# Patient Record
Sex: Male | Born: 2000 | Race: Black or African American | Hispanic: No | Marital: Single | Smoking: Never smoker
Health system: Southern US, Community
[De-identification: ages and names within clinical notes are randomized; demographics above are authoritative.]

---
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
## 2019-09-10 ENCOUNTER — Emergency Department
Admission: EM | Admit: 2019-09-10 | Discharge: 2019-09-10 | Disposition: A | Discharge status: Home / Self Care | Payer: BLUE CROSS/BLUE SHIELD | Attending: Emergency Medicine | Admitting: Emergency Medicine

## 2019-09-10 ENCOUNTER — Other Ambulatory Visit: Payer: Self-pay

## 2019-09-10 DIAGNOSIS — M79642 Pain in left hand: Secondary | ICD-10-CM | POA: Insufficient documentation

## 2019-09-10 DIAGNOSIS — Z5321 Procedure and treatment not carried out due to patient leaving prior to being seen by health care provider: Secondary | ICD-10-CM | POA: Insufficient documentation

## 2019-09-10 NOTE — ED Triage Notes (Signed)
Pt arrived via POV with reports of left palm pain, states he has a spot which resembles a callous that he has had for several months, pt states he recently played top golf and thinks that worsened the pain. Pt states the pain is 4/10 currently.

## 2021-08-16 IMAGING — CT CT ABD-PELV W/ CM
2 of 4 series · 16 of 46 positions shown, 18 images · IV contrast (APPLIED)
Comparison: Abdomen radiograph [REDACTED] [DATE]

CLINICAL DATA: Sudden onset of sharp right lower quadrant abdominal
pain and nausea and vomiting.

EXAM:
CT ABDOMEN AND PELVIS WITH CONTRAST
TECHNIQUE: Multidetector CT imaging of the abdomen and pelvis was performed
using the standard protocol following bolus administration of
intravenous contrast.
CONTRAST:  100mL OMNIPAQUE IOHEXOL 300 MG/ML  SOLN

[Series 2: axial st · axial · 0.64mm/px · z∈[-398,-28]mm · 13 of 82 slices shown, 15 images]
[im 4/82  soft-tissue]
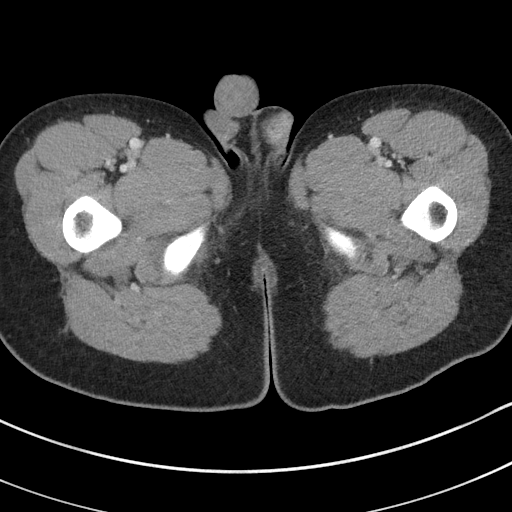
[im 4/82  bone]
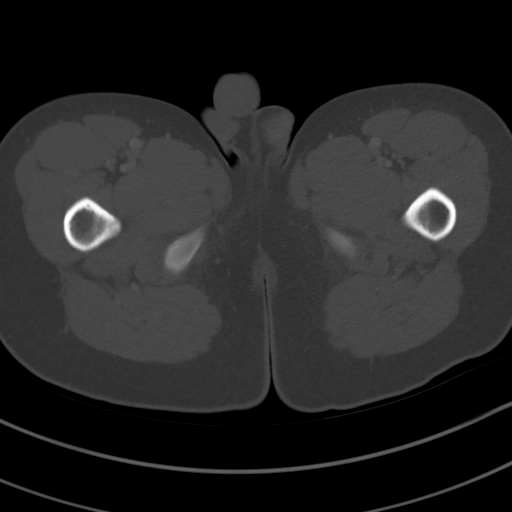
[im 10/82  soft-tissue]
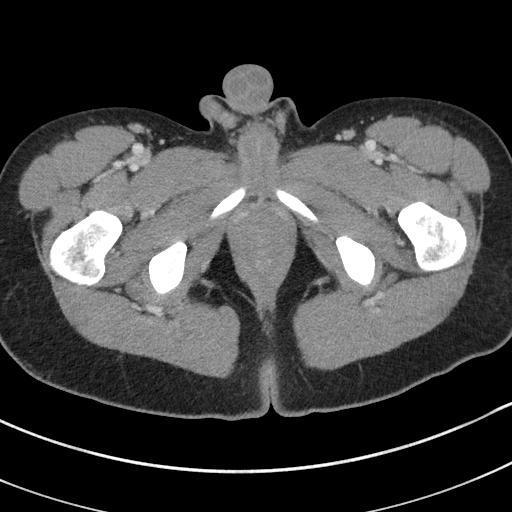
[im 17/82  soft-tissue]
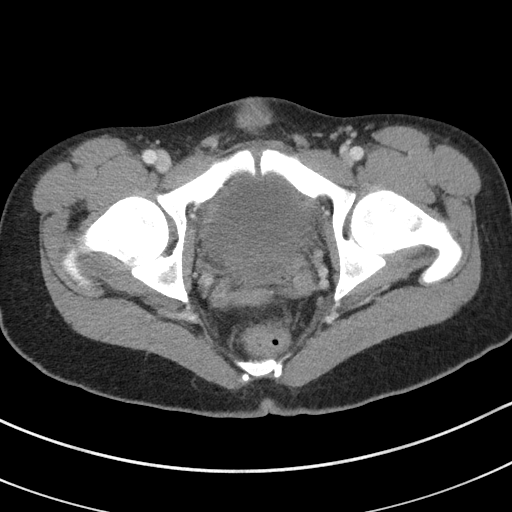
[im 23/82  soft-tissue]
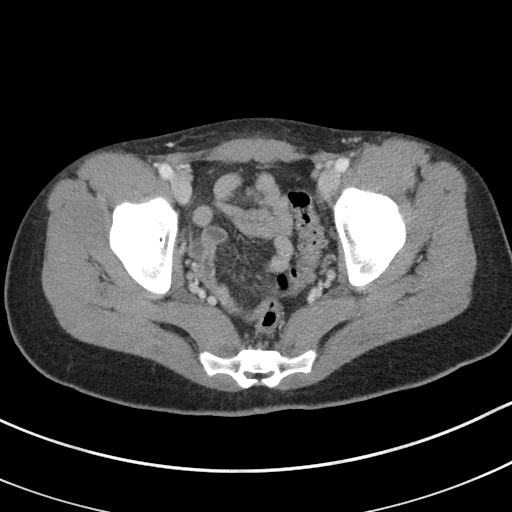
[im 30/82  soft-tissue]
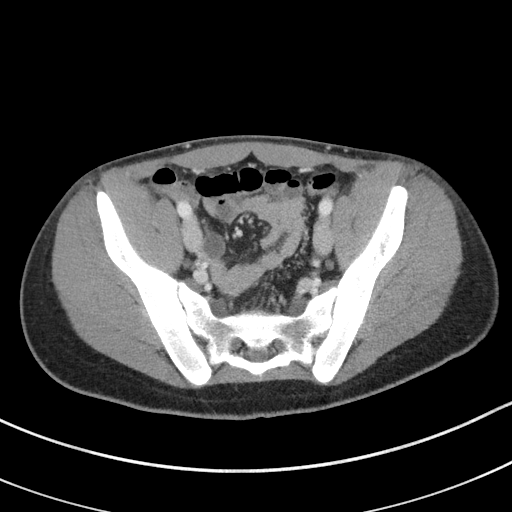
[im 36/82  soft-tissue]
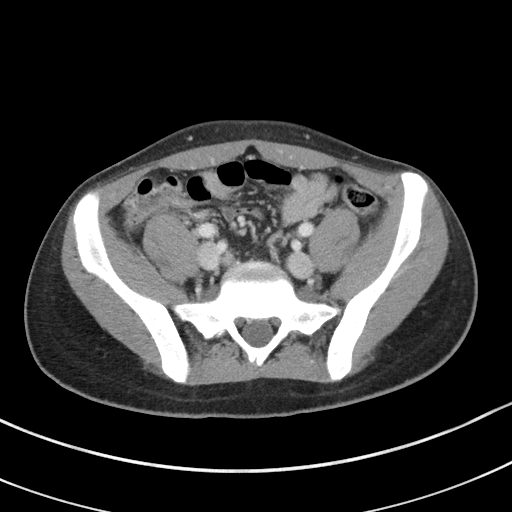
[im 43/82  soft-tissue]
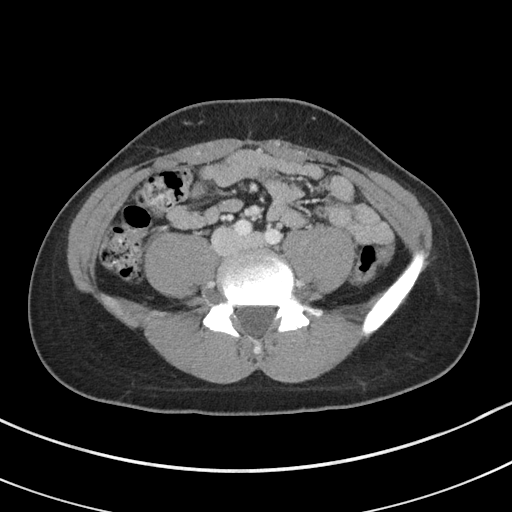
[im 46/82  soft-tissue]
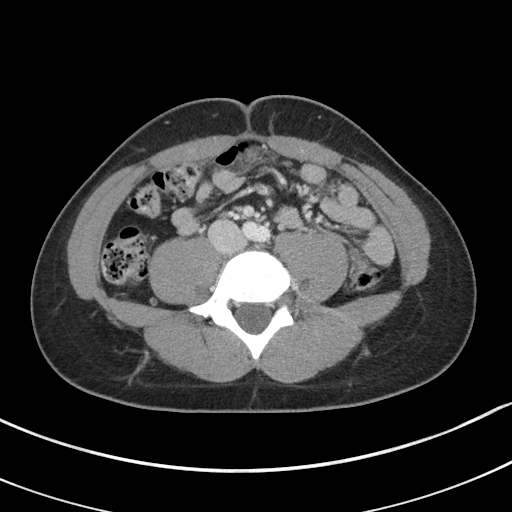
[im 52/82  soft-tissue]
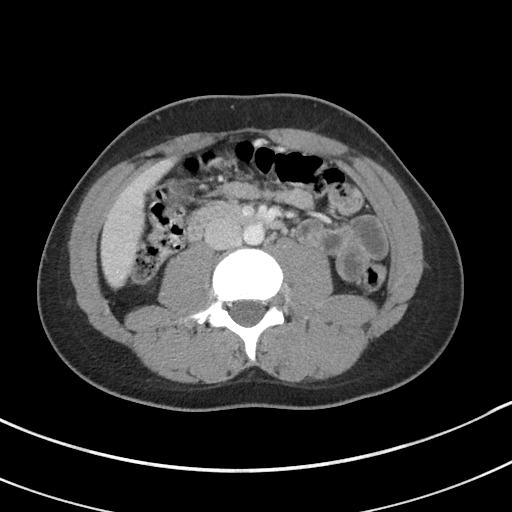
[im 52/82  bone]
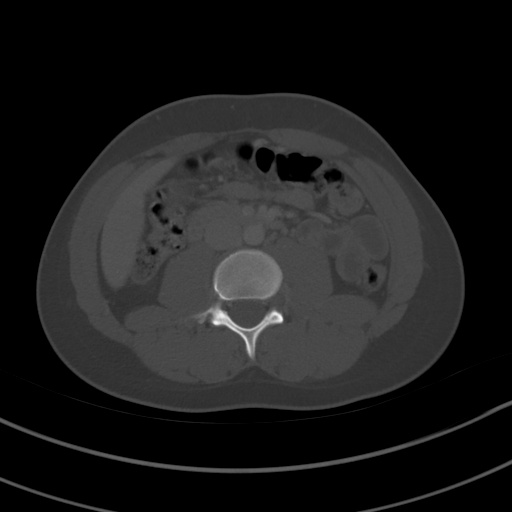
[im 59/82  soft-tissue]
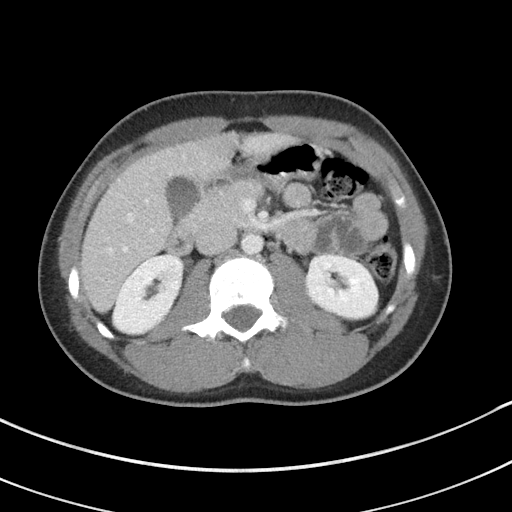
[im 65/82  soft-tissue]
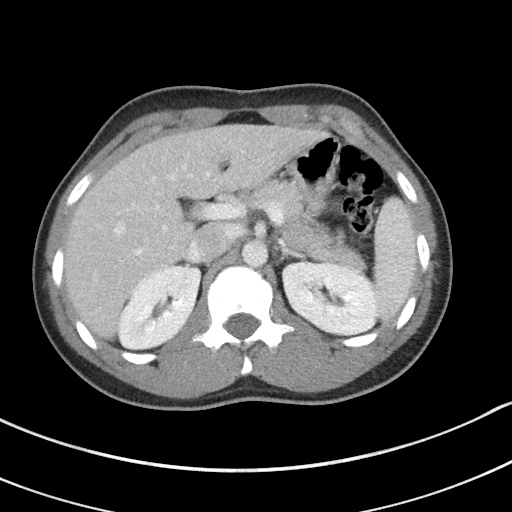
[im 72/82  soft-tissue]
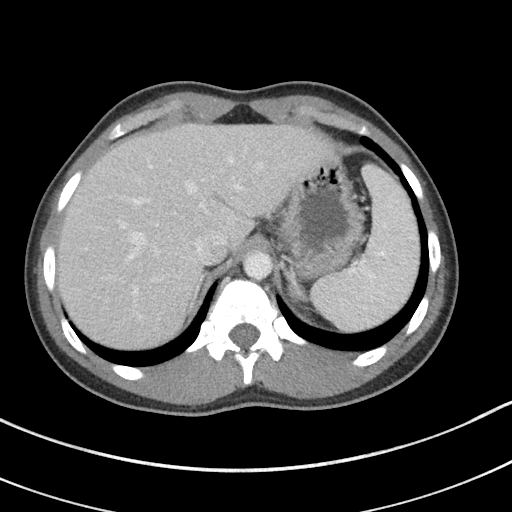
[im 78/82  soft-tissue]
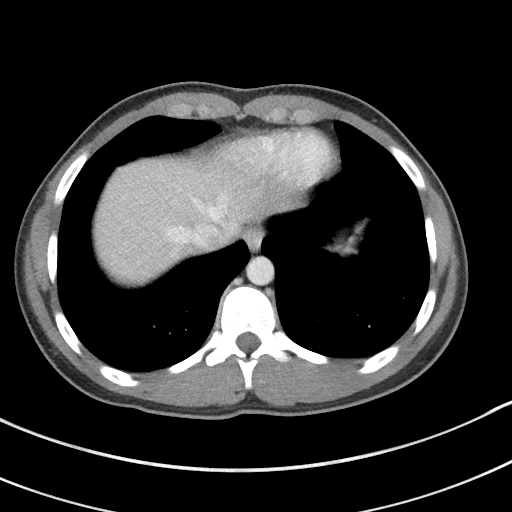

[Series 5: coronal st · coronal · 0.70mm/px · 3 of 68 slices shown]
[im 23/68  soft-tissue]
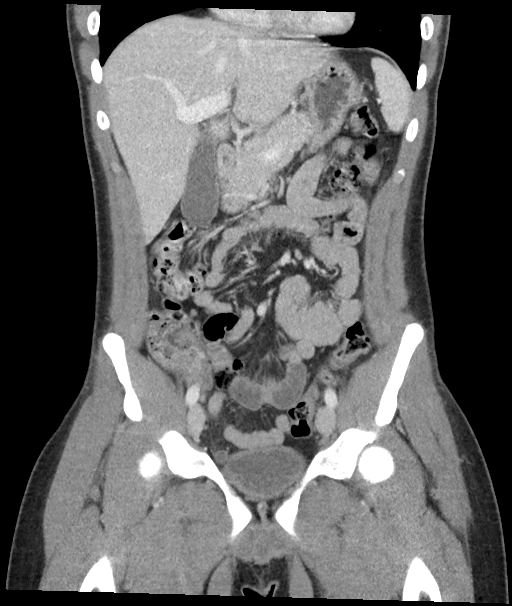
[im 30/68  soft-tissue]
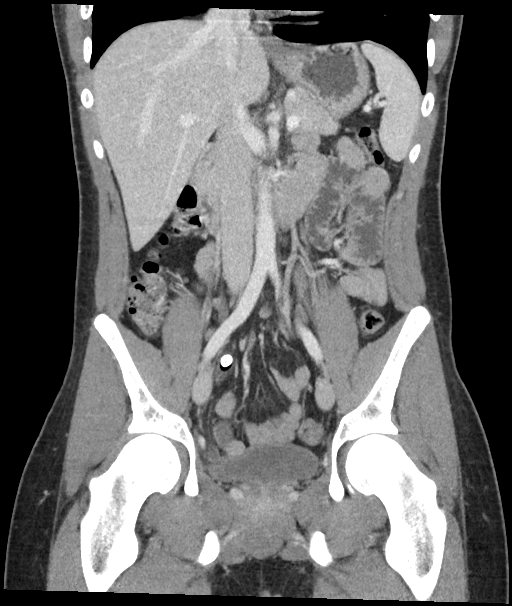
[im 38/68  soft-tissue]
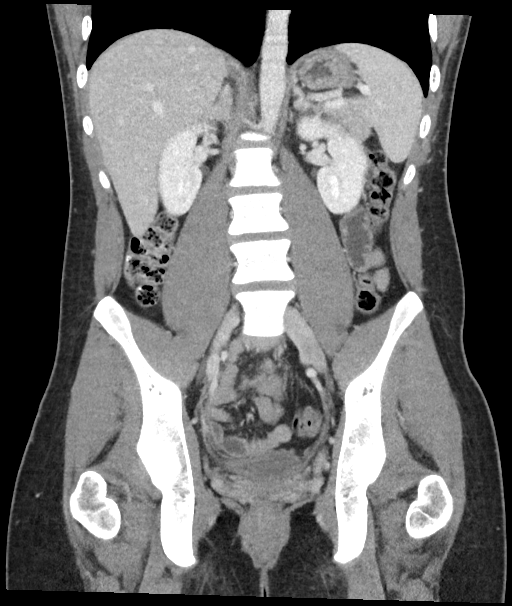

[16 of 46 positions shown; findings below may reference images not displayed]

FINDINGS: Lower chest: Unremarkable

Hepatobiliary: Unremarkable

Pancreas: Unremarkable

Spleen: Unremarkable

Adrenals/Urinary Tract: Unremarkable

Stomach/Bowel: On image 50/2, there is a large (1.3 by 0.8 by
cm) appendicolith in the appendix. Distal to this in the appendix
there is considerable dilatation, an air-fluid level, and abnormal
periappendiceal stranding. There is also a second more distal
appendicolith measuring 0.4 cm. Overall the appearance favors acute
appendicitis due to this large appendicolith. No appendiceal rupture
correction luminal gas.

Vascular/Lymphatic: Unremarkable

Reproductive: Unremarkable

Other: No supplemental non-categorized findings.

Musculoskeletal: Unremarkable
IMPRESSION: 1. Mild acute appendicitis, with periappendiceal stranding and
distal appendiceal dilation related to a large appendicolith
probably causing some degree of appendiceal obstruction. No
extraluminal gas or abscess.

## 2022-06-18 ENCOUNTER — Ambulatory Visit
Admission: EM | Admit: 2022-06-18 | Discharge: 2022-06-18 | Disposition: A | Discharge status: Home / Self Care | Payer: BC Managed Care – PPO | Attending: Internal Medicine | Admitting: Internal Medicine

## 2022-06-18 DIAGNOSIS — R509 Fever, unspecified: Secondary | ICD-10-CM

## 2022-06-18 LAB — POCT RAPID STREP A (OFFICE): Rapid Strep A Screen: NEGATIVE

## 2022-06-18 NOTE — Discharge Instructions (Addendum)
Follow up here or with your primary care provider if your symptoms are worsening or not improving.     

## 2022-06-18 NOTE — ED Triage Notes (Signed)
Pt presents with fever, body aches and sore throat starting last night. Temp 99.8 this morning. No meds today.  Throat was scratchy, is not hurting now.

## 2022-06-18 NOTE — ED Provider Notes (Addendum)
Andre Stanley    CSN: [id number] Arrival date & time: 06/18/22  1950      History   Chief Complaint Chief Complaint  Patient presents with   Fever    HPI Andre Stanley is a 22 y.o. male.    Fever   Patient presents to urgent care with complaint of fever, body aches, sore throat starting last night.  He endorses persistent low-grade fever of 99.8 F.  Reports "scratchy" throat which is not painful at the moment.  History reviewed. No pertinent past medical history.  Patient Active Problem List   Diagnosis Date Noted   Status post laparoscopic appendectomy 12/15/2018    Past Surgical History:  Procedure Laterality Date   LAPAROSCOPIC APPENDECTOMY N/A 11/30/2018   Procedure: APPENDECTOMY LAPAROSCOPIC;  Surgeon: Campbell Lerner, MD;  Location: [facility] ORS;  Service: General;  Laterality: N/A;       Home Medications    Prior to Admission medications   Medication Sig Start Date End Date Taking? Authorizing Provider  ibuprofen (ADVIL) 600 MG tablet Take 1 tablet (600 mg total) by mouth every 8 (eight) hours as needed for mild pain or moderate pain. 12/02/18  Yes Sung Amabile, DO    Family History History reviewed. No pertinent family history.  Social History Social History   Tobacco Use   Smoking status: Never   Smokeless tobacco: Never  Vaping Use   Vaping Use: Never used  Substance Use Topics   Alcohol use: Never   Drug use: Never     Allergies   Patient has no known allergies.   Review of Systems Review of Systems  Constitutional:  Positive for fever.     Physical Exam Triage Vital Signs ED Triage Vitals [06/18/22 2011]  Enc Vitals Group     BP 110/64     Pulse Rate 74     Resp 18     Temp 100.2 F (37.9 C)     Temp Source Oral     SpO2 96 %     Weight      Height      Head Circumference      Peak Flow      Pain Score 0     Pain Loc      Pain Edu?      Excl. in GC?    No data found.  Updated Vital Signs BP 110/64 (BP  Location: Left Arm)   Pulse 74   Temp 100.2 F (37.9 C) (Oral)   Resp 18   SpO2 96%   Visual Acuity Right Eye Distance:   Left Eye Distance:   Bilateral Distance:    Right Eye Near:   Left Eye Near:    Bilateral Near:     Physical Exam Vitals reviewed.  Constitutional:      Appearance: Normal appearance. He is not ill-appearing.  HENT:     Right Ear: Tympanic membrane normal.     Left Ear: Tympanic membrane normal.     Mouth/Throat:     Mouth: Mucous membranes are moist.     Pharynx: Posterior oropharyngeal erythema present. No oropharyngeal exudate.  Cardiovascular:     Rate and Rhythm: Normal rate and regular rhythm.     Pulses: Normal pulses.     Heart sounds: Normal heart sounds.  Pulmonary:     Effort: Pulmonary effort is normal.     Breath sounds: Normal breath sounds.  Abdominal:     General: Bowel sounds are normal.  Palpations: Abdomen is soft.     Comments: Endorses mild periumbilical discomfort with deep palpation.  Skin:    General: Skin is warm and dry.  Neurological:     General: No focal deficit present.     Mental Status: He is alert and oriented to person, place, and time.  Psychiatric:        Mood and Affect: Mood normal.        Behavior: Behavior normal.      UC Treatments / Results  Labs (all labs ordered are listed, but only abnormal results are displayed) Labs Reviewed  POCT RAPID STREP A (OFFICE)    EKG   Radiology No results found.  Procedures Procedures (including critical care time)  Medications Ordered in UC Medications - No data to display  Initial Impression / Assessment and Plan / UC Course  I have reviewed the triage vital signs and the nursing notes.  Pertinent labs & imaging results that were available during my care of the patient were reviewed by me and considered in my medical decision making (see chart for details).   Andre Stanley is a 22 y.o. male presenting with fever. Patient is afebrile without recent  antipyretics, satting well on room air. Overall is very well appearing, well hydrated, without respiratory distress. Pulmonary exam is unremarkable.  Lungs CTAB without wheezing, rhonchi, rales. RRR.  There is pharyngeal erythema but without peritonsillar exudates.  Abdomen is soft.  Bowel sounds normoactive.  Mild periumbilical tenderness with deep palpation.  Reviewed relevant chart history.   Patient's symptoms are consistent with an acute viral process.  There is no evidence of bacterial infection possible pharyngeal erythema.  Given he has no throat pain currently and only mild throat pain reported earlier, this is low on the differential. Rapid strep is negative. Recommending supportive care and use of OTC medication for symptom control as needed.  Counseled patient on potential for adverse effects with medications prescribed/recommended today, ER and return-to-clinic precautions discussed, patient verbalized understanding and agreement with care plan.  Final Clinical Impressions(s) / UC Diagnoses   Final diagnoses:  None   Discharge Instructions   None    ED Prescriptions   None    PDMP not reviewed this encounter.   Charma Igo, FNP 06/18/22 2049    Charma Igo, FNP 06/18/22 2053

## 2023-02-25 ENCOUNTER — Ambulatory Visit
Admission: EM | Admit: 2023-02-25 | Discharge: 2023-02-25 | Disposition: A | Discharge status: Home / Self Care | Payer: BC Managed Care – PPO | Attending: Emergency Medicine | Admitting: Emergency Medicine

## 2023-02-25 DIAGNOSIS — J069 Acute upper respiratory infection, unspecified: Secondary | ICD-10-CM | POA: Diagnosis not present

## 2023-02-25 MED ORDER — PREDNISONE 10 MG (21) PO TBPK: ORAL_TABLET | Freq: Every day | ORAL | 0 refills | Status: AC

## 2023-02-25 MED ORDER — BENZONATATE 100 MG PO CAPS: 100.0000 mg | ORAL_CAPSULE | Freq: Three times a day (TID) | ORAL | 0 refills | Status: AC

## 2023-02-25 MED ORDER — PROMETHAZINE-DM 6.25-15 MG/5ML PO SYRP: 5.0000 mL | ORAL_SOLUTION | Freq: Every evening | ORAL | 0 refills | Status: AC | PRN

## 2023-02-25 NOTE — Discharge Instructions (Addendum)
Your symptoms today are most likely being caused by a virus and should steadily improve in time it can take up to 7 to 10 days before you truly start to see a turnaround however things will get better  Begin prednisone every morning with food as directed to open and relax the airway  May use Tessalon pill every 8 hours as needed to help with coughing, may use cough syrup at bedtime for comfort   You can take Tylenol and/or Ibuprofen as needed for fever reduction and pain relief.   For cough: honey 1/2 to 1 teaspoon (you can dilute the honey in water or another fluid).  You can also use guaifenesin and dextromethorphan for cough. You can use a humidifier for chest congestion and cough.  If you don't have a humidifier, you can sit in the bathroom with the hot shower running.      For sore throat: try warm salt water gargles, cepacol lozenges, throat spray, warm tea or water with lemon/honey, popsicles or ice, or OTC cold relief medicine for throat discomfort.   For congestion: take a daily anti-histamine like Zyrtec, Claritin, and a oral decongestant, such as pseudoephedrine.  You can also use Flonase 1-2 sprays in each nostril daily.   It is important to stay hydrated: drink plenty of fluids (water, gatorade/powerade/pedialyte, juices, or teas) to keep your throat moisturized and help further relieve irritation/discomfort.

## 2023-02-25 NOTE — ED Provider Notes (Signed)
Andre Stanley    CSN: [id number] Arrival date & time: 02/25/23  1006      History   Chief Complaint Chief Complaint  Patient presents with   Cough   Nasal Congestion    HPI Andre Stanley is a 23 y.o. male.   Patient presents for evaluation of nasal congestion, rhinorrhea, productive cough with yellow, centralized chest tightness, shortness of breath only after eating 2 days ago.  1 day ago, has been negative.  Has attempted use of Sudafed.  Tolerating food and liquids.  known sick contacts.  History reviewed. No pertinent past medical history.  Patient Active Problem List   Diagnosis Date Noted   Status post laparoscopic appendectomy 12/15/2018    Past Surgical History:  Procedure Laterality Date   LAPAROSCOPIC APPENDECTOMY N/A 11/30/2018   Procedure: APPENDECTOMY LAPAROSCOPIC;  Surgeon: Campbell Lerner, MD;  Location: [facility] ORS;  Service: General;  Laterality: N/A;       Home Medications    Prior to Admission medications   Medication Sig Start Date End Date Taking? Authorizing Provider  benzonatate (TESSALON) 100 MG capsule Take 1 capsule (100 mg total) by mouth every 8 (eight) hours. 02/25/23  Yes Quillan Whitter R, NP  predniSONE (STERAPRED UNI-PAK 21 TAB) 10 MG (21) TBPK tablet Take by mouth daily. Take 6 tabs by mouth daily  for 1 days, then 5 tabs for 1 days, then 4 tabs for 1 days, then 3 tabs for 1 days, 2 tabs for 1 days, then 1 tab by mouth daily for 1 days 02/25/23  Yes Staci Carver R, NP  promethazine-dextromethorphan (PROMETHAZINE-DM) 6.25-15 MG/5ML syrup Take 5 mLs by mouth at bedtime as needed. 02/25/23  Yes Xitlali Kastens R, NP  ibuprofen (ADVIL) 600 MG tablet Take 1 tablet (600 mg total) by mouth every 8 (eight) hours as needed for mild pain or moderate pain. 12/02/18   Sung Amabile, DO    Family History History reviewed. No pertinent family history.  Social History Social History   Tobacco Use   Smoking status: Never   Smokeless  tobacco: Never  Vaping Use   Vaping status: Never Used  Substance Use Topics   Alcohol use: Never   Drug use: Never     Allergies   Patient has no known allergies.   Review of Systems Review of Systems   Physical Exam Triage Vital Signs ED Triage Vitals [02/25/23 1029]  Encounter Vitals Group     BP 132/82     Systolic BP Percentile      Diastolic BP Percentile      Pulse Rate (!) 58     Resp 18     Temp 99 F (37.2 C)     Temp Source Oral     SpO2 96 %     Weight      Height      Head Circumference      Peak Flow      Pain Score      Pain Loc      Pain Education      Exclude from Growth Chart    No data found.  Updated Vital Signs BP 132/82 (BP Location: Right Arm)   Pulse (!) 58   Temp 99 F (37.2 C) (Oral)   Resp 18   SpO2 96%   Visual Acuity Right Eye Distance:   Left Eye Distance:   Bilateral Distance:    Right Eye Near:   Left Eye Near:    Bilateral  Near:     Physical Exam Constitutional:      Appearance: Normal appearance.  HENT:     Head: Normocephalic.     Right Ear: Tympanic membrane, ear canal and external ear normal.     Left Ear: Tympanic membrane, ear canal and external ear normal.     Nose: Congestion present. No rhinorrhea.     Mouth/Throat:     Mouth: Mucous membranes are moist.     Pharynx: Oropharynx is clear. Posterior oropharyngeal erythema present. No oropharyngeal exudate.  Eyes:     Extraocular Movements: Extraocular movements intact.  Cardiovascular:     Rate and Rhythm: Normal rate and regular rhythm.     Pulses: Normal pulses.     Heart sounds: Normal heart sounds.  Pulmonary:     Effort: Pulmonary effort is normal.     Breath sounds: Normal breath sounds.  Musculoskeletal:     Cervical back: Normal range of motion and neck supple.  Neurological:     Mental Status: He is alert and oriented to person, place, and time. Mental status is at baseline.      UC Treatments / Results  Labs (all labs ordered are  listed, but only abnormal results are displayed) Labs Reviewed - No data to display  EKG   Radiology No results found.  Procedures Procedures (including critical care time)  Medications Ordered in UC Medications - No data to display  Initial Impression / Assessment and Plan / UC Course  I have reviewed the triage vital signs and the nursing notes.  Pertinent labs & imaging results that were available during my care of the patient were reviewed by me and considered in my medical decision making (see chart for details).  Viral URI with cough  Patient is in no signs of distress nor toxic appearing.  Vital signs are stable.  Low suspicion for pneumonia, pneumothorax or bronchitis and therefore will defer imaging.  Prescribed prednisone, Tessalon and promethazine DM.May use additional over-the-counter medications as needed for supportive care.  May follow-up with urgent care as needed if symptoms persist or worsen.  Note given.   Final Clinical Impressions(s) / UC Diagnoses   Final diagnoses:  Viral URI with cough     Discharge Instructions      Your symptoms today are most likely being caused by a virus and should steadily improve in time it can take up to 7 to 10 days before you truly start to see a turnaround however things will get better  Begin prednisone every morning with food as directed to open and relax the airway  May use Tessalon pill every 8 hours as needed to help with coughing, may use cough syrup at bedtime for comfort   You can take Tylenol and/or Ibuprofen as needed for fever reduction and pain relief.   For cough: honey 1/2 to 1 teaspoon (you can dilute the honey in water or another fluid).  You can also use guaifenesin and dextromethorphan for cough. You can use a humidifier for chest congestion and cough.  If you don't have a humidifier, you can sit in the bathroom with the hot shower running.      For sore throat: try warm salt water gargles, cepacol  lozenges, throat spray, warm tea or water with lemon/honey, popsicles or ice, or OTC cold relief medicine for throat discomfort.   For congestion: take a daily anti-histamine like Zyrtec, Claritin, and a oral decongestant, such as pseudoephedrine.  You can also use Flonase 1-2  sprays in each nostril daily.   It is important to stay hydrated: drink plenty of fluids (water, gatorade/powerade/pedialyte, juices, or teas) to keep your throat moisturized and help further relieve irritation/discomfort.    ED Prescriptions     Medication Sig Dispense Auth. Provider   predniSONE (STERAPRED UNI-PAK 21 TAB) 10 MG (21) TBPK tablet Take by mouth daily. Take 6 tabs by mouth daily  for 1 days, then 5 tabs for 1 days, then 4 tabs for 1 days, then 3 tabs for 1 days, 2 tabs for 1 days, then 1 tab by mouth daily for 1 days 21 tablet Chantavia Bazzle R, NP   benzonatate (TESSALON) 100 MG capsule Take 1 capsule (100 mg total) by mouth every 8 (eight) hours. 21 capsule Thedora Rings R, NP   promethazine-dextromethorphan (PROMETHAZINE-DM) 6.25-15 MG/5ML syrup Take 5 mLs by mouth at bedtime as needed. 118 mL Adina Puzzo, Elita Boone, NP      PDMP not reviewed this encounter.   Valinda Hoar, NP 02/25/23 1101

## 2023-02-25 NOTE — ED Triage Notes (Signed)
Cough, congestion, chest tightness x 2 days. Taking sudafed.   Was tested for flu and covid all were negative yesterday.

## 2023-09-12 ENCOUNTER — Encounter: Payer: Self-pay | Admitting: *Deleted

## 2023-09-12 ENCOUNTER — Emergency Department
Admission: EM | Admit: 2023-09-12 | Discharge: 2023-09-12 | Disposition: A | Discharge status: Home / Self Care | Attending: Emergency Medicine | Admitting: Emergency Medicine

## 2023-09-12 ENCOUNTER — Other Ambulatory Visit: Payer: Self-pay

## 2023-09-12 DIAGNOSIS — L0501 Pilonidal cyst with abscess: Secondary | ICD-10-CM | POA: Insufficient documentation

## 2023-09-12 MED ORDER — CLINDAMYCIN HCL 300 MG PO CAPS: 300.0000 mg | ORAL_CAPSULE | Freq: Three times a day (TID) | ORAL | 0 refills | Status: AC

## 2023-09-12 MED ORDER — CLINDAMYCIN HCL 150 MG PO CAPS
300.0000 mg | ORAL_CAPSULE | Freq: Once | ORAL | Status: AC
  Administered 2023-09-12: 300 mg via ORAL
  Filled 2023-09-12: qty 2

## 2023-09-12 NOTE — Discharge Instructions (Addendum)
 Please take antibiotic as prescribed for its entire course.  Please call the number provided for general surgery to arrange follow-up for further evaluation and treatment.  Return to the emergency department for any increased swelling, increased pain or development of fever.

## 2023-09-12 NOTE — ED Triage Notes (Signed)
 Pt reports he has a bump just above his tailbone area for over a week, he talked with his PCP and was prescribed mupircon cream for the same. Tonight, has had drainage from the area, he denies fevers.

## 2023-09-12 NOTE — ED Provider Notes (Signed)
   [facility] Provider Note    Event Date/Time   First MD Initiated Contact with Patient 09/12/23 0451     (approximate)  History   Chief Complaint: Abscess  HPI  Andre Stanley is a 23 y.o. male with no significant past medical history who presents to the emergency department for an abscess.  According to the patient over the last few days he has noticed increasing abscess just above his buttocks with increasing pain and noticed some drainage tonight so he came to the emergency department.  Patient states he has had similar abscess once previously to this area that resolved on its own.  No fever.  Physical Exam   Triage Vital Signs: ED Triage Vitals  Encounter Vitals Group     BP 09/12/23 0442 131/76     Girls Systolic BP Percentile --      Girls Diastolic BP Percentile --      Boys Systolic BP Percentile --      Boys Diastolic BP Percentile --      Pulse Rate 09/12/23 0442 (!) 52     Resp 09/12/23 0442 16     Temp 09/12/23 0442 97.7 F (36.5 C)     Temp Source 09/12/23 0442 Oral     SpO2 09/12/23 0442 98 %     Weight --      Height --      Head Circumference --      Peak Flow --      Pain Score 09/12/23 0441 0     Pain Loc --      Pain Education --      Exclude from Growth Chart --     Most recent vital signs: Vitals:   09/12/23 0442  BP: 131/76  Pulse: (!) 52  Resp: 16  Temp: 97.7 F (36.5 C)  SpO2: 98%    General: Awake, no distress.  CV:  Good peripheral perfusion. Resp:  Normal effort.   Abd:  No distention.  Other:  Patient has what appears to be a pilonidal cyst measuring approximately 2 x 2 cm.  It is spontaneously draining.  I was able to apply pressure and drain approximately 5 to 10 cc of exudative drainage.   ED Results / Procedures / Treatments   MEDICATIONS ORDERED IN ED: Medications  clindamycin  (CLEOCIN ) capsule 300 mg (has no administration in time range)     IMPRESSION / MDM / ASSESSMENT AND PLAN / ED COURSE   I reviewed the triage vital signs and the nursing notes.  Patient's presentation is most consistent with acute illness / injury with system symptoms.  Patient presents emergency department for an abscess above his buttocks consistent with pilonidal cyst.  Cyst is already draining spontaneously.  I applied pressure and drained approximately 5 to 10 cc of exudative drainage.  No fever.  Reassuring vitals.  We will place patient on clindamycin  3 times daily for 10 days have the patient follow-up with general surgery as needed.  Discussed return precautions.  Patient agreeable to plan.  FINAL CLINICAL IMPRESSION(S) / ED DIAGNOSES   Pilonidal cyst   Note:  This document was prepared using Dragon voice recognition software and may include unintentional dictation errors.   Dorothyann Drivers, MD 09/12/23 0500
